# Patient Record
Sex: Male | Born: 1970 | Race: White | Hispanic: No | Marital: Single | Smoking: Current every day smoker
Health system: Southern US, Community
[De-identification: ages and names within clinical notes are randomized; demographics above are authoritative.]

## PROBLEM LIST (undated history)

## (undated) DIAGNOSIS — B1920 Unspecified viral hepatitis C without hepatic coma: Secondary | ICD-10-CM

## (undated) DIAGNOSIS — F1020 Alcohol dependence, uncomplicated: Secondary | ICD-10-CM

## (undated) DIAGNOSIS — B977 Papillomavirus as the cause of diseases classified elsewhere: Secondary | ICD-10-CM

---

## 1998-03-01 ENCOUNTER — Inpatient Hospital Stay (HOSPITAL_COMMUNITY): Admission: EM | Admit: 1998-03-01 | Discharge: 1998-03-04 | Payer: Self-pay | Admitting: Emergency Medicine

## 1998-07-03 ENCOUNTER — Inpatient Hospital Stay (HOSPITAL_COMMUNITY): Admission: EM | Admit: 1998-07-03 | Discharge: 1998-07-07 | Payer: Self-pay | Admitting: Psychiatry

## 2000-02-02 ENCOUNTER — Inpatient Hospital Stay (HOSPITAL_COMMUNITY): Admission: EM | Admit: 2000-02-02 | Discharge: 2000-02-06 | Payer: Self-pay | Admitting: Psychiatry

## 2001-03-11 ENCOUNTER — Emergency Department (HOSPITAL_COMMUNITY): Admission: EM | Admit: 2001-03-11 | Discharge: 2001-03-12 | Payer: Self-pay

## 2001-04-03 ENCOUNTER — Inpatient Hospital Stay (HOSPITAL_COMMUNITY): Admission: EM | Admit: 2001-04-03 | Discharge: 2001-04-07 | Payer: Self-pay | Admitting: Psychiatry

## 2001-04-03 ENCOUNTER — Encounter: Payer: Self-pay | Admitting: Emergency Medicine

## 2001-06-07 ENCOUNTER — Ambulatory Visit (HOSPITAL_COMMUNITY): Admission: RE | Admit: 2001-06-07 | Discharge: 2001-06-07 | Payer: Self-pay | Admitting: Neurology

## 2008-01-12 ENCOUNTER — Encounter: Admission: RE | Admit: 2008-01-12 | Discharge: 2008-01-12 | Payer: Self-pay | Admitting: Family Medicine

## 2009-09-26 IMAGING — CT CT ABDOMEN W/O CM
3 of 7 series · 12 of 42 positions shown, 18 images · non-contrast
Comparison: NONE

CLINICAL DATA: Severe abdominal pain.  Right upper quadrant 
firmness.  

CT ABDOMEN WITHOUT AND WITH INTRAVENOUS AND FOLLOWING ORAL 
CONTRAST
TECHNIQUE: Multiple axial images were obtained from the 
diaphragm to the iliac crest.

[Series 2: wo · axial · 0.80mm/px · z∈[+759,+939]mm · 5 of 55 slices shown]
[im 10/55  soft-tissue]
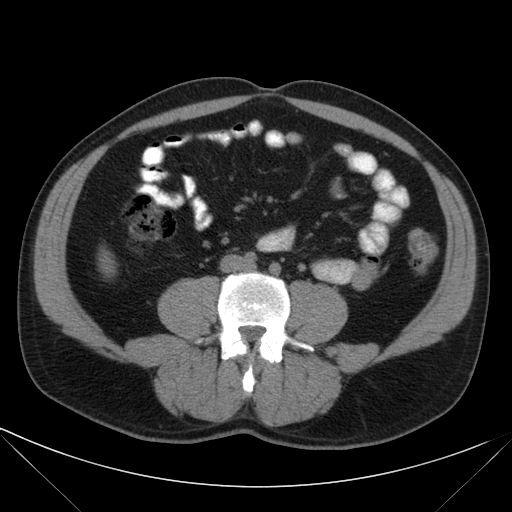
[im 19/55  soft-tissue]
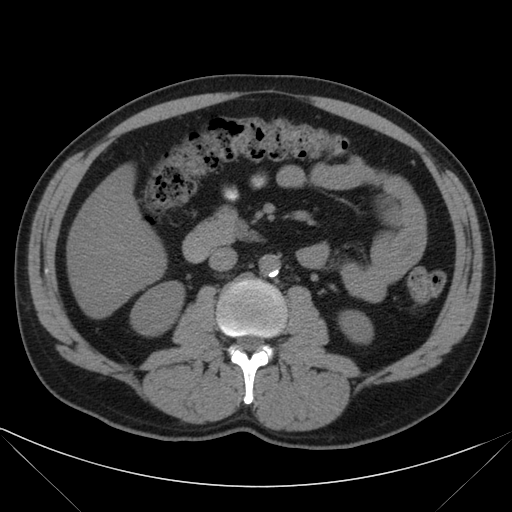
[im 28/55  soft-tissue]
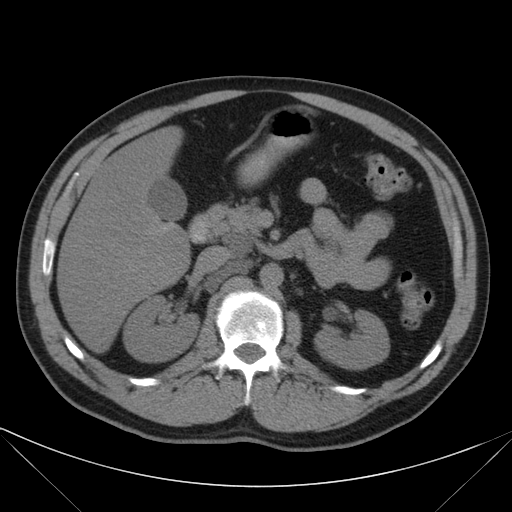
[im 37/55  soft-tissue]
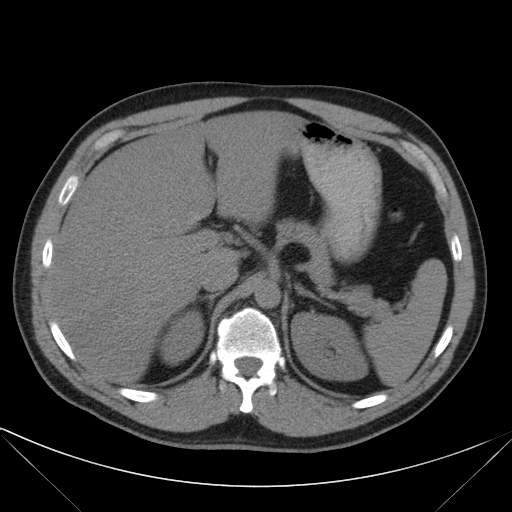
[im 46/55  soft-tissue]
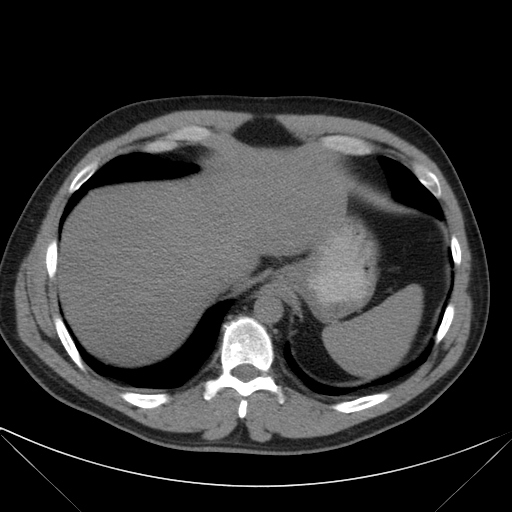

[Series 4: venous · axial · portal-venous · 0.79mm/px · z∈[+764,+928]mm · 4 of 56 slices shown, 9 images]
[im 12/56  soft-tissue]
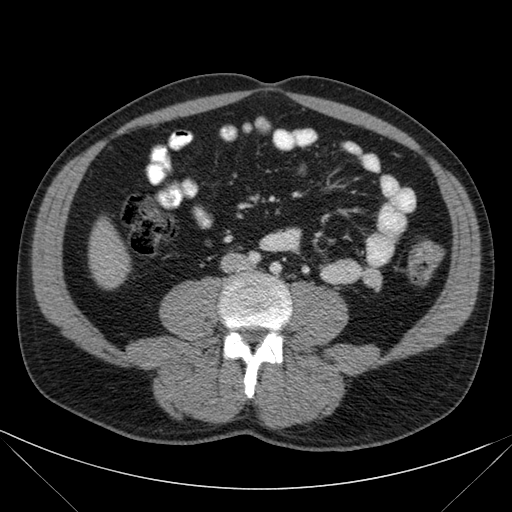
[im 12/56  lung]
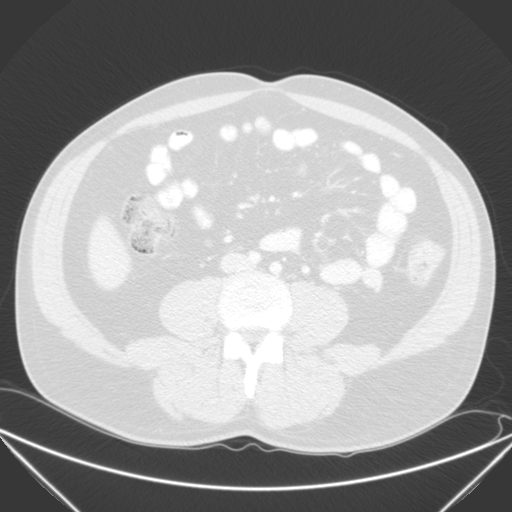
[im 12/56  bone]
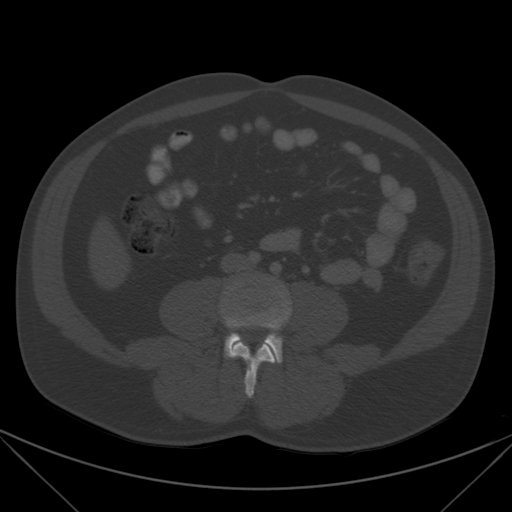
[im 23/56  soft-tissue]
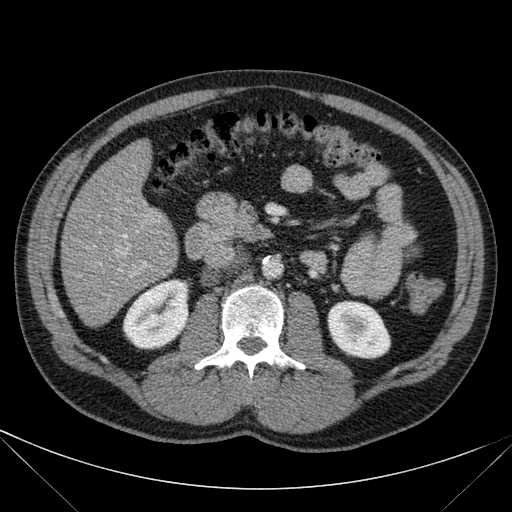
[im 23/56  lung]
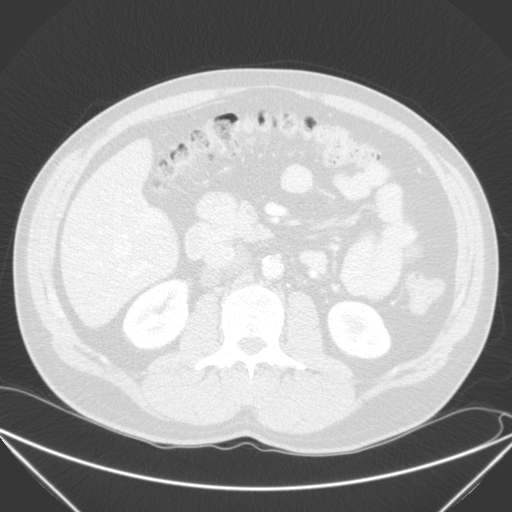
[im 34/56  soft-tissue]
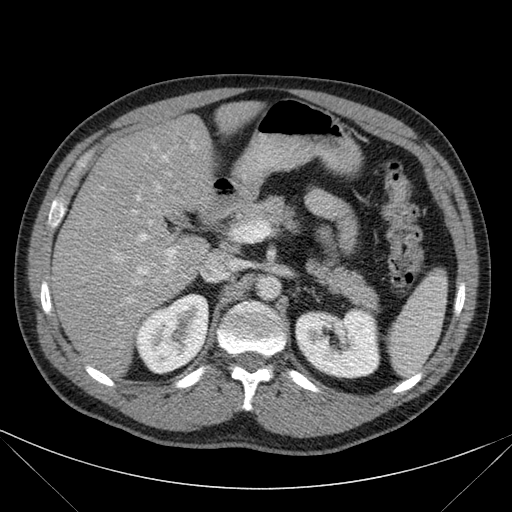
[im 34/56  lung]
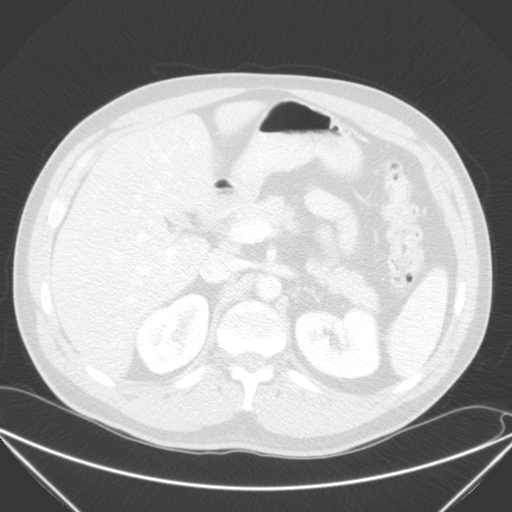
[im 45/56  soft-tissue]
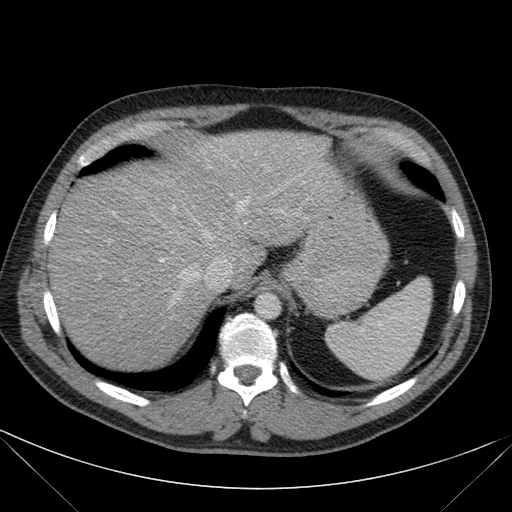
[im 45/56  lung]
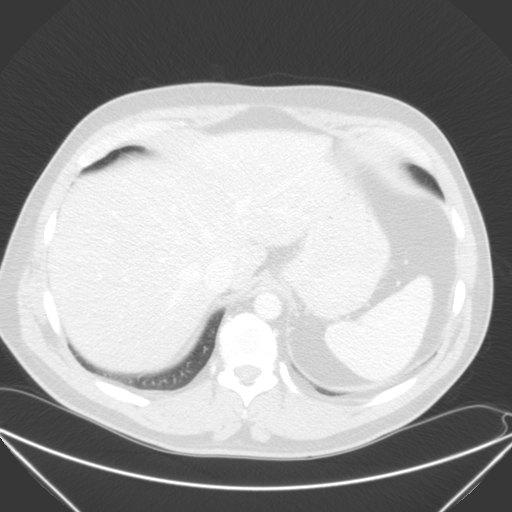

[coronals · coronal · 0.79mm/px · 3 of 88 slices shown, 4 images]
[im 30/88  soft-tissue]
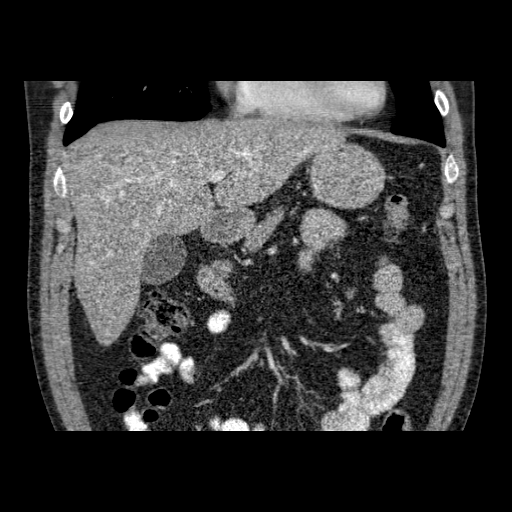
[im 39/88  soft-tissue]
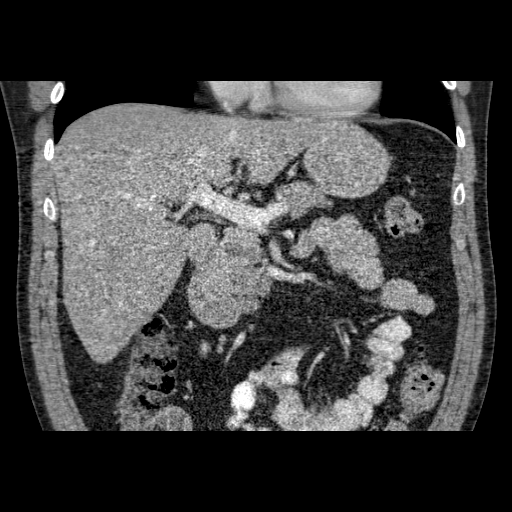
[im 39/88  bone]
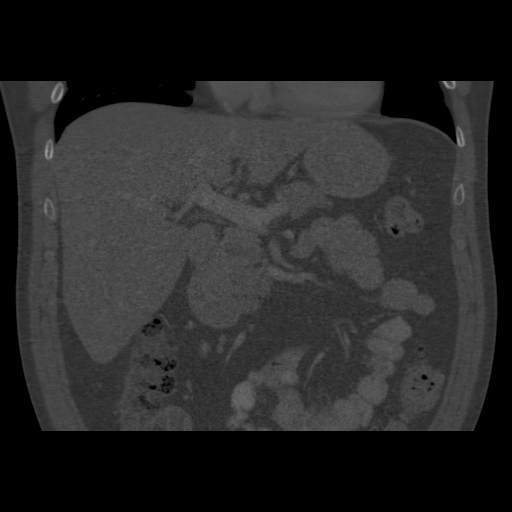
[im 49/88  soft-tissue]
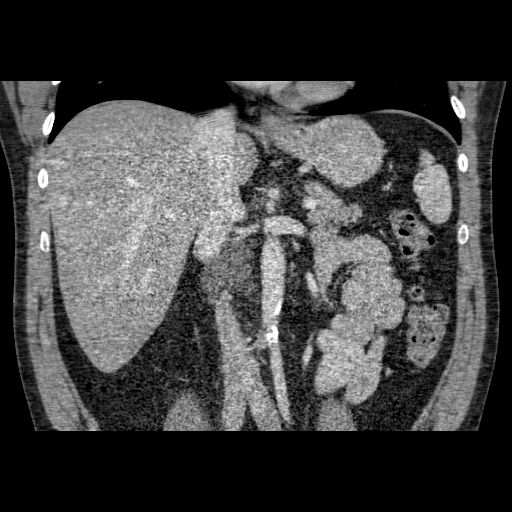

[12 of 42 positions shown; findings below may reference images not displayed]

FINDINGS: No gallstones or renal calculi are identified. There is 
low density in the liver consistent with fatty infiltration. There 
is a low-density collection in the retrocaval and paracaval region 
in the upper abdomen, most likely a lymphocele.  No evidence of 
abdominal aortic aneurysm.  This area does not enhance on the 
post-contrast images. No adrenal or renal mass or evidence of 
renal obstruction.  No bowel or mesenteric mass, adenopathy, or 
inflammatory process. No lung base mass, infiltrate, edema, or 
effusion.  No lytic or blastic lesions are identified.
IMPRESSION: Water-density area adjacent to the vena cava and 
extending into the retrocaval region.  This measures approximately 
5.0 cm transversely, 1.8 cm in depth, and 4.5 cm 
superior-inferior. This most likely represents a lymphocele.  No 
mass, adenopathy, or inflammatory process is noted otherwise. Mild 
fatty liver.  Consider follow up in six months to document 
stability of what is most likely a lymphocele. [place], [place] Date:  01/11/2008 DAS  JLM

## 2010-09-11 NOTE — H&P (Signed)
[facility]  Patient:    Matthew Gentry, Matthew Gentry Visit Number: [id number] MRN: [id number]          Service Type: PSY Location: 500 [id number] Attending Physician:  Rachael Fee Dictated by:   Candi Leash. Orsini, N.P. Admit Date:  04/03/2001                     Psychiatric Admission Assessment  IDENTIFYING INFORMATION:  A 40 year old single white male, voluntarily admitted on April 03, 2001, for depression, alcohol dependence and benzodiazepine abuse.  HISTORY OF PRESENT ILLNESS:  The patient presents with a history of depression, alcohol dependence and benzodiazepine abuse.  The patient reports he has been drinking steadily, up to 6 to 10 beers or a pint of liquor per day for the past 4 years.  The patient relapsed shortly after his last hospitalization of one year ago.  He states his longest period of sobriety has been approximately maybe 1 month.  The patient stopped drinking on his own about 5 days ago.  The patient had a seizure at home with his child present, was transferred to the emergency department.  The patient reports he also stopped using benzodiazepines about 2 weeks ago.  The patient reports some hallucinations during his withdrawal at home, reports none currently.  He reports some depressive symptoms but no suicidal ideation.  He reports some homicidal thoughts towards a boyfriend of a friend that he knows, but with no plan or intent.  His sleep has been fair, his appetite has been satisfactory. The patient reports he is motivated to stop as he "wants this Christmas to be different than last Christmas."  The patient denies any withdrawal symptoms, complains of a current headache.  PAST PSYCHIATRIC HISTORY:  Fourth hospitalization to [facility], last visit was 1 year ago, in October 2001, for depression and alcohol abuse.  SOCIAL HISTORY:  He is a 40 year old single white male with one child, 70 years of age.  He lives alone.   He has joint custody of the child with his mother.  He works as a Nutritional therapist.  He states he will be unemployed.  He has completed the 8th grade.  No legal history.  FAMILY HISTORY:  None.  ALCOHOL DRUG HISTORY:  The patient smokes.  He has been drinking 6-10 beers a day or if not that 1 pint of liquor.  The patient reports that his last drink was approximately 1 week ago when he tried to stop on his own.  He has about a 1 month history of sobriety, with a history of seizures that occurred the day of admission.  The patient reports a long use of Xanax.  He has been on that for years, buys them off the streets.  Last use was 2 weeks ago.  Has been taking 1-2 mg a day.  PAST MEDICAL HISTORY:  Primary care Ron[facility]well is none.  Medical problems are genital herpes.  MEDICATIONS:  The patient has been on Depakote for headaches and Xanax, nonprescribed.  DRUG ALLERGIES:  TRAZODONE.  The patient reports his chest hurts.  PHYSICAL EXAMINATION:  Performed at [facility] Emergency Department.  The patient does present with bruises to his face from his seizure.  His CT scan was negative.  Urine drug screen was positive for THC and positive for benzos. Alcohol level was less than 5.  Blood sugar initially was 193, on December 10 it was 109.  Depakote level was less than 1.  WBC  count was elevated at 15.4. SGOT was elevated at 42. SGPT was elevated at 55.  MENTAL STATUS EXAMINATION:  He is an alert, young middle-aged Caucasian male. He is dressed in scrubs.  No eye contact.  Speech is normal and relevant. Mood is depressed.  Affect is blunt.  Thought processes are coherent.  There is no evidence of psychosis, no auditory or visual hallucinations, no suicidal ideation.  Some positive homicidal ideation with no plan or intent.  Cognitive function is intact.  Judgment is fair, insight is fair, memory is intact.  ADMISSION DIAGNOSES: Axis I:    1. Depressive disorder not otherwise specified.             2. Alcohol dependence.            3. Benzodiazepine dependence.            4. THC abuse. Axis II:   Deferred. Axis III:  Genital herpes. Axis IV:   Problems with occupation and other psychosocial problems. Axis V:    Current 40, estimated this past year 65.  INITIAL PLAN OF CARE:  Voluntary admission to [facility] for alcohol and benzodiazepine dependence and depression.  Contract for safety, check every 15 minutes.  Will initiate the phenobarbital protocol for withdrawal symptoms to prevent seizures.  Will encourage fluids.  The patient is on seizure precautions and is considered to be a high fall risk.  We will repeat his CBC.  Our goal is to detox safely, to increase coping skills, for patient to be medication compliant, to attend CDIOP program or AA meetings after discharge, for patient to remain sober and drug free.  Will monitor depressive symptoms and initiate an antidepressant if deemed necessary.  TENTATIVE LENGTH OF STAY:  3 to 5 days. Dictated by:   Candi Leash. Orsini, N.P. Attending Physician:  Rachael Fee DD:  04/04/01 TD:  04/04/01 Job: [id number]
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
## 2010-09-11 NOTE — Discharge Summary (Signed)
[facility]  Patient:    Matthew Gentry, Matthew Gentry Visit Number: [id number] MRN: [id number]          Service Type: PSY Location: 500 [id number] 02 Attending Physician:  Jeanice Lim Dictated by:   Jeanice Lim, M.D. Admit Date:  04/03/2001 Discharge Date: 04/07/2001                             Discharge Summary  IDENTIFYING DATA:  This is a 40 year old single Caucasian male voluntarily admitted on December 9 for depression, alcohol dependence, and benzodiazepine abuse with escalating use, unable to stop drinking on his own, having tried and resulted in a seizure in his childs presence.  This is the fourth hospitalization at [facility]; last one was one year ago for depression and alcohol use.  MEDICATIONS: 1. Depakote for headaches. 2. Xanax unprescribed.  ALLERGIES:   TRAZODONE.  PHYSICAL EXAMINATION:  VITAL SIGNS:  Stable, afebrile, unremarkable.  GENERAL:  Positive for bruises on his face from his seizure.  NEUROLOGIC:  Nonfocal.  LABORATORY DATA:  CT scan was negative.  Urine drug screen: Positive for marijuana and benzodiazepines.  Blood sugar initially 193, which then returned to 109.  Depakote level was 0.  White blood cell count was mildly elevated at 15.4.  SGOT elevated at 42, SGPT elevated at 55.  MENTAL STATUS EXAMINATION:  He is an alert, young, middle-aged Caucasian male dressed scrubs, maintaining poor eye contact.  Speech: Within normal limits. Mood: Depressed.  Affect: Blunt.  Thought process: Goal directed.  Thought content: Negative for psychotic symptoms, no dangerous ideation, admitted to some homicidal thoughts with no plan or intent.  Cognitive: Intact.  Judgment and insight were also intact.  ADMITTING DIAGNOSES: Axis I:    1. Depressive disorder, not otherwise specified.            2. Alcohol dependence.            3. Benzodiazepine dependence.            4. Marijuana abuse. Axis II:   None. Axis III:  Genital  herpes. Axis IV:   Moderate problems with occupation and psychosocial support. Axis V:    40/65.  HOSPITAL COURSE:  The patient was ordered routine p.r.n. medications and medications were started, Zoloft targeting depressive symptoms.  He was given phenobarbital and monitored for withdrawal and scheduled to follow up with the CD IOP.  The patient denied significant withdrawal symptoms and had no psychotic symptoms, dangerous ideations, or seizure activity.  CONDITION AT DISCHARGE:  Improved.  Mood was euthymic.  Affect: Bright. Thought process: Goal directed.  Thought content: Negative for psychotic symptoms or dangerous ideation and he reported motivation to be compliant with with a follow-up plan.  DISCHARGE MEDICATIONS: 1. Phenobarbital 15 mg take one dose at 8 p.m. on the day of discharge. 2. Zoloft 25 mg q.a.m.  RECOMMENDATIONS:  The patient was advised to avoid alcohol and benzodiazepines and follow up with CD IOP and then [facility] OPC and AA and NA as well as primary care physician regarding elevated blood pressure.  DISCHARGE DIAGNOSES: Axis I:    1. Depressive disorder, not otherwise specified.            2. Alcohol dependence.            3. Benzodiazepine dependence.            4. Marijuana abuse. Axis II:   None. Axis III:  Genital herpes. Axis IV:   Moderate problems with occupation and psychosocial support. Axis V:    Global assessment of functioning on discharge was 55. Dictated by:   Jeanice Lim, M.D. Attending Physician:  Jeanice Lim DD:  05/23/01 TD:  05/24/01 Job: [id number]

## 2010-09-11 NOTE — Discharge Summary (Signed)
[facility]  Patient:    Matthew Gentry, Matthew Gentry                     MRN: [id number] Adm. Date:  [id number] Disc. Date: 02/06/00 Attending:  Marlyn Corporal Fabmy                           Discharge Summary  ADMISSION DIAGNOSES: Axis I:    Major depressive disorder, recurrent, with psychotic features. Axis II:   Deferred. Axis III:  Human papillomavirus. Axis IV:   Severe, occupational problems and other psychosocial stressors. Axis V:    GAF of 35 on admission, 90 in past year.  DISCHARGE DIAGNOSES: Axis I:    Major depressive disorder, recurrent, with psychotic features. Axis II:   Deferred. Axis III:  Human papillomavirus. Axis IV:   Severe with occupational and other psychosocial problems. Axis V:    GAF of 35 on admission and 80 on discharge.  BRIEF HISTORY:  The patient is a 40 year old single Caucasian male who volunteered this admission to the [facility].  Patient was referred from [facility].  Patient has been experiencing overwhelming feelings of aloneness coupled with suicidal ideas without specific plan.  He has been anorexic and insomniac.  He has been using excessive amounts of alcohol to induce sleep.  He has a history in the past of drug use, but not recently.  He has a history of recurrent depression and has been tried on Prozac and Trazodone in the past.  He became agitated on Prozac and says he is allergic to Trazodone.  In March 2000 he overdosed on Xanax and was detoxified.  COURSE IN HOSPITAL:  The patient was detoxified on a p.r.n. Librium protocol. This did not show evidence of any major or minor symptoms of withdrawal from alcohol.  On February 03, 2000 he was started on a combination of Depakote ER, Seroquel, and Effexor with benefit.  The patient tolerated his medication well and without any side effects.  He started sleeping through the night and now wakes up with a clear head.  He assures me that we  have interrupted the alcoholic pattern.  He is dependent on more sleep and now that he is sleeping through the night, he does not need to drink anymore.  In terms of the admission lab work, his CBC was essentially unremarkable. Routine chemistry was unremarkable.  Hepatic profile showed elevation of the SGOT at 46 and SGPT at 85.  These results were discussed with the patient and their implications were again discussed with him.  Given that his grandfather died of cirrhosis of the liver, the patient well understands the relationship between fatty infiltration and eventually cirrhosis of the liver.  Depakote level was at 22.4 and will be repeated in 2-3 weeks.  However, given the patients clinical response at this point, I feel that he is _________ improved.  CONDITION ON DISCHARGE:  Patient is alert and is reactive.  His mood is stable.  He categorically denies suicidal or homicidal _________ .  There is no evidence of any psychotic symptoms and his thinking is clear and goal directed.  He is motivated to start the IOP program Monday and today he says he is ready to move on with his life.  MEDICATION AND FOLLOW-UP:  Patient is discharged with prescriptions for Depakote ER 500 mg two h.s., Seroquel 25 mg two h.s., and Effexor XR 75 mg q.d.  He is to start IOP on Monday and is to follow with me at the office in four weeks. DD:  02/06/00 TD:  02/06/00 Job: [id number]

## 2017-10-10 ENCOUNTER — Emergency Department (HOSPITAL_COMMUNITY): Admission: EM | Admit: 2017-10-10 | Discharge: 2017-10-10 | Discharge status: Absent without leave | Payer: Self-pay

## 2017-10-10 NOTE — ED Notes (Signed)
Pt not answering for triage  

## 2017-10-10 NOTE — ED Notes (Addendum)
Pt left from triage room, this RN asked him multiple times to please return to triage room. Pt refused. Went out to waiting room, sat on floor, this RN grabbed SalidaJeff PA to notify him. Pt was no longer in waiting room when this RN and Trey PaulaJeff PA went to waiting room. Pt had left ED waiting room.

## 2017-10-11 ENCOUNTER — Encounter (HOSPITAL_COMMUNITY): Payer: Self-pay | Admitting: Emergency Medicine

## 2017-10-11 ENCOUNTER — Emergency Department (HOSPITAL_COMMUNITY)
Admission: EM | Admit: 2017-10-11 | Discharge: 2017-10-11 | Disposition: A | Discharge status: Home / Self Care | Payer: Self-pay | Attending: Emergency Medicine | Admitting: Emergency Medicine

## 2017-10-11 ENCOUNTER — Ambulatory Visit (HOSPITAL_COMMUNITY)
Admission: RE | Admit: 2017-10-11 | Discharge: 2017-10-11 | Disposition: A | Discharge status: Home / Self Care | Payer: Self-pay | Attending: Psychiatry | Admitting: Psychiatry

## 2017-10-11 DIAGNOSIS — R109 Unspecified abdominal pain: Secondary | ICD-10-CM | POA: Insufficient documentation

## 2017-10-11 DIAGNOSIS — F1020 Alcohol dependence, uncomplicated: Secondary | ICD-10-CM | POA: Insufficient documentation

## 2017-10-11 DIAGNOSIS — Z5321 Procedure and treatment not carried out due to patient leaving prior to being seen by health care provider: Secondary | ICD-10-CM | POA: Insufficient documentation

## 2017-10-11 HISTORY — DX: Unspecified viral hepatitis C without hepatic coma: B19.20

## 2017-10-11 HISTORY — DX: Papillomavirus as the cause of diseases classified elsewhere: B97.7

## 2017-10-11 HISTORY — DX: Alcohol dependence, uncomplicated: F10.20

## 2017-10-11 LAB — COMPREHENSIVE METABOLIC PANEL
ALK PHOS: 85 U/L (ref 38–126)
ALT: 22 U/L (ref 17–63)
AST: 26 U/L (ref 15–41)
Albumin: 3.7 g/dL (ref 3.5–5.0)
Anion gap: 7 (ref 5–15)
BUN: 18 mg/dL (ref 6–20)
CALCIUM: 9.1 mg/dL (ref 8.9–10.3)
CO2: 29 mmol/L (ref 22–32)
CREATININE: 1.15 mg/dL (ref 0.61–1.24)
Chloride: 108 mmol/L (ref 101–111)
GFR calc Af Amer: >60 mL/min (ref >60)
GFR calc non Af Amer: >60 mL/min (ref >60)
GLUCOSE: 108 mg/dL — AB (ref 65–99)
Potassium: 4.1 mmol/L (ref 3.5–5.1)
SODIUM: 144 mmol/L (ref 135–145)
Total Bilirubin: 1.1 mg/dL (ref 0.3–1.2)
Total Protein: 6.9 g/dL (ref 6.5–8.1)

## 2017-10-11 LAB — CBC
HCT: 48.2 % (ref 39.0–52.0)
Hemoglobin: 16.4 g/dL (ref 13.0–17.0)
MCH: 34.9 pg — AB (ref 26.0–34.0)
MCHC: 34 g/dL (ref 30.0–36.0)
MCV: 102.6 fL — ABNORMAL HIGH (ref 78.0–100.0)
PLATELETS: 227 10*3/uL (ref 150–400)
RBC: 4.7 MIL/uL (ref 4.22–5.81)
RDW: 14.2 % (ref 11.5–15.5)
WBC: 8.8 10*3/uL (ref 4.0–10.5)

## 2017-10-11 LAB — LIPASE, BLOOD: Lipase: 39 U/L (ref 11–51)

## 2017-10-11 MED ORDER — LACTATED RINGERS IV SOLN: INTRAVENOUS | Status: DC

## 2017-10-11 NOTE — ED Notes (Signed)
Pt aware we need a urine specimen. 

## 2017-10-11 NOTE — ED Notes (Signed)
Pt called for in lobby and while getting a wheelchair for pt, pt walked to bathroom and before getting in bathroom he was informed we needed a urine sample and to wait to use the bathroom before going. Pt ignored instructions and proceeded to go into bathroom and urinate. Pt informed that the UA takes about an hour to result once obtaining sample. Pt encouraged that next time he needs to urinate to go to restroom across the hall and provide sample. Urine cup at bedside.

## 2017-10-11 NOTE — ED Triage Notes (Signed)
Pt states he is here for right upper abd pain for two days. Pt states it comes "like contractions." Denies n/v/diarrhea.

## 2017-10-11 NOTE — ED Notes (Signed)
Pt ambulates well. Steady gait noted. No assistance needed.

## 2017-10-11 NOTE — ED Notes (Addendum)
Pt belligerent, ripping off all monitoring equipment and refusing IV, fluids, and CT scan.  EDP aware.

## 2017-10-11 NOTE — ED Provider Notes (Signed)
I signed up to see this patient, but he left prior to evaluation, at 10:45 AM.  Patient had been in the ED yesterday also left after a brief time here and [facility], but left before seeing a provider.  Apparently the patient is intoxicated.     Mancel BaleWentz, Arcadia Gorgas, MD 10/11/17 [phone]

## 2017-10-11 NOTE — ED Notes (Addendum)
Pt stating "I'm leaving".  This RN encouraged pt to stay for further evaluation.  Pt still refusing.  Pt dressed himself and walked out with girlfriend.

## 2017-10-11 NOTE — BH Assessment (Signed)
Assessment Note  Matthew Gentry is an 47 y.o. male.  -Clinician was informed that patient was ready to be seen.  Patient refused to come back to assessment interview rooms.  Clinician went to lobby and talked to patient.  Clinician talked to patient about his physical pain because he rated it at 10/10 for abdominal pain.    Pt says he has pain on left side of abdominal area.  He says it hurts a lot.  Patient denies suicidal thoughts, intention or plan.  No HI.    Pt is accompanied by girlfriend.  She said today that patient was having a conversation with someone else in the room when no one was there.  Patient says he was "talking with my dead grandmother."  Patient says that she was telling him he was messing up his life.  Patient says he started drinking at the current rate six months ago.  Patient says he drinks a 5th per day.  He last drank today.  He has had DUIs and has a court date on 11-01-17.  Patient did not want to come back to the [facility] rooms for Nira Conn, FNP to see him.  When Barbara Cower went to the lobby to see patient, he had left.    Diagnosis: F10.20 ETOH use d/o severe  Past Medical History: No past medical history on file.    Family History: No family history on file.  Social History:  has no tobacco, alcohol, and drug history on file.  Additional Social History:  Alcohol / Drug Use Pain Medications: None Prescriptions: None Over the Counter: None History of alcohol / drug use?: Yes Withdrawal Symptoms: Patient aware of relationship between substance abuse and physical/medical complications, Weakness, Diarrhea, Sweats, Fever / Chills, Irritability, Nausea / Vomiting, Tremors Substance #1 Name of Substance 1: ETOH 1 - Age of First Use: 47 years of age 48 - Amount (size/oz): 1/5th per day 1 - Frequency: Daily use 1 - Duration: on-going 1 - Last Use / Amount: 06/17 around 13:00  CIWA:   COWS:    Allergies: Allergies not on file  Home Medications:  (Not  in a hospital admission)  OB/GYN Status:  No LMP for male patient.  General Assessment Data Location of Assessment: [facility] Assessment Services TTS Assessment: In system Is this a Tele or Face-to-Face Assessment?: Face-to-Face Is this an Initial Assessment or a Re-assessment for this encounter?: Initial Assessment Marital status: Divorced Is patient pregnant?: No Pregnancy Status: No Living Arrangements: Spouse/significant other Can pt return to current living arrangement?: Yes Admission Status: Voluntary Is patient capable of signing voluntary admission?: Yes Referral Source: Self/Family/Friend(gf brought patient in to [facility].) Insurance type: self pay     Crisis Care Plan Living Arrangements: Spouse/significant other Name of Psychiatrist: None Name of Therapist: None  Education Status Is patient currently in school?: No Is the patient employed, unemployed or receiving disability?: Unemployed  Risk to self with the past 6 months Suicidal Ideation: No Has patient been a risk to self within the past 6 months prior to admission? : No Suicidal Intent: No Has patient had any suicidal intent within the past 6 months prior to admission? : No Is patient at risk for suicide?: No Suicidal Plan?: No Has patient had any suicidal plan within the past 6 months prior to admission? : No Access to Means: No What has been your use of drugs/alcohol within the last 12 months?: ETOH Previous Attempts/Gestures: No How many times?: 0 Other Self Harm Risks: SA issues  Triggers for Past Attempts: None known Intentional Self Injurious Behavior: None Family Suicide History: No Recent stressful life event(s): Legal Issues, Financial Problems, Turmoil (Comment) Persecutory voices/beliefs?: No Depression: Yes Depression Symptoms: Despondent, Insomnia, Isolating, Guilt, Loss of interest in usual pleasures Substance abuse history and/or treatment for substance abuse?: Yes Suicide prevention information  given to non-admitted patients: Not applicable  Risk to Others within the past 6 months Homicidal Ideation: No Does patient have any lifetime risk of violence toward others beyond the six months prior to admission? : No Thoughts of Harm to Others: No Current Homicidal Intent: No Current Homicidal Plan: No Access to Homicidal Means: No Identified Victim: No one History of harm to others?: No Assessment of Violence: None Noted Violent Behavior Description: None reported Does patient have access to weapons?: No Criminal Charges Pending?: Yes Describe Pending Criminal Charges: DUI Does patient have a court date: Yes Court Date: 11/01/17 Is patient on probation?: Unknown  Psychosis Hallucinations: Auditory, Visual("I was talking to my dead grandmother today) Delusions: None noted  Mental Status Report Appearance/Hygiene: Disheveled Eye Contact: Poor Motor Activity: Freedom of movement, Unsteady Speech: Logical/coherent Level of Consciousness: Alert Mood: Depressed, Apprehensive, Helpless, Irritable Affect: Anxious, Sad Anxiety Level: Severe Thought Processes: Coherent, Relevant Judgement: Impaired Orientation: Appropriate for developmental age Obsessive Compulsive Thoughts/Behaviors: None  Cognitive Functioning Concentration: Decreased Memory: Recent Impaired, Remote Intact Is patient IDD: No Is patient DD?: Yes Insight: Fair Impulse Control: Poor Appetite: Good Have you had any weight changes? : No Change Sleep: Decreased Total Hours of Sleep: (<4H/D) Vegetative Symptoms: None  ADLScreening [facility] Assessment Services) Patient's cognitive ability adequate to safely complete daily activities?: Yes Patient able to express need for assistance with ADLs?: Yes Independently performs ADLs?: Yes (appropriate for developmental age)  Prior Inpatient Therapy Prior Inpatient Therapy: Yes Prior Therapy Dates: 2002 Prior Therapy Facilty/Provider(s): [facility]  [place] Reason for Treatment: Detox  Prior Outpatient Therapy Prior Outpatient Therapy: No Does patient have an ACCT team?: No Does patient have Intensive In-House Services?  : No Does patient have [facility] services? : No Does patient have P4CC services?: No  ADL Screening (condition at time of admission) Patient's cognitive ability adequate to safely complete daily activities?: Yes Is the patient deaf or have difficulty hearing?: No Does the patient have difficulty seeing, even when wearing glasses/contacts?: No Does the patient have difficulty concentrating, remembering, or making decisions?: Yes Patient able to express need for assistance with ADLs?: Yes Does the patient have difficulty dressing or bathing?: No Independently performs ADLs?: Yes (appropriate for developmental age) Does the patient have difficulty walking or climbing stairs?: No Weakness of Legs: Both(Pt unsteady on his feet.) Weakness of Arms/Hands: None       Abuse/Neglect Assessment (Assessment to be complete while patient is alone) Abuse/Neglect Assessment Can Be Completed: Yes Physical Abuse: Denies Verbal Abuse: Denies Sexual Abuse: Denies Exploitation of patient/patient's resources: Denies Self-Neglect: Denies     Merchant navy officer (For Healthcare) Does Patient Have a Medical Advance Directive?: No Would patient like information on creating a medical advance directive?: No - Patient declined    Additional Information 1:1 In Past 12 Months?: No CIRT Risk: No Elopement Risk: No Does patient have medical clearance?: No     Disposition:  Disposition Initial Assessment Completed for this Encounter: Yes Disposition of Patient: AMA Discharge Patient refused recommended treatment: Yes Type of treatment offered and refused: Other (Comment)(To go to [facility] for med clearance) Mode of transportation if patient is discharged?: Car Patient referred to: Other (  Comment)([facility] for medical clearance)  On  Site Evaluation by:   Reviewed with Physician:    Beatriz StallionHarvey, Erlene Devita Ray 10/11/2017 1:47 AM

## 2017-10-11 NOTE — ED Notes (Signed)
Pt now stating LUQ pain versus the original RUQ complaint.

## 2017-10-11 NOTE — H&P (Signed)
Behavioral Health Medical Screening Exam  Matthew Gentry is an 47 y.o. male who [facility] for alcohol abuse. Patient was briefly seen in the lobby by TTS. According to security, patient left with one of the people that came in with him. Per TTS patient denied suicidal thoughts/homicidal thoughts and patient did not indicate these on his intake sheet. Patient left prior to being seen by this provider. [facility] exam.    Matthew PolingJason A Talmage Teaster, NP 10/11/2017, 1:43 AM

## 2018-04-04 ENCOUNTER — Emergency Department (HOSPITAL_COMMUNITY)
Admission: EM | Admit: 2018-04-04 | Discharge: 2018-04-05 | Disposition: A | Discharge status: Home / Self Care | Payer: Self-pay | Attending: Emergency Medicine | Admitting: Emergency Medicine

## 2018-04-04 DIAGNOSIS — F1094 Alcohol use, unspecified with alcohol-induced mood disorder: Secondary | ICD-10-CM

## 2018-04-04 DIAGNOSIS — F1020 Alcohol dependence, uncomplicated: Secondary | ICD-10-CM | POA: Insufficient documentation

## 2018-04-04 DIAGNOSIS — F17210 Nicotine dependence, cigarettes, uncomplicated: Secondary | ICD-10-CM | POA: Insufficient documentation

## 2018-04-04 LAB — ETHANOL: Alcohol, Ethyl (B): 300 mg/dL — ABNORMAL HIGH (ref <10)

## 2018-04-04 LAB — CBC WITH DIFFERENTIAL/PLATELET
Abs Immature Granulocytes: 0.06 10*3/uL (ref 0.00–0.07)
BASOS PCT: 1 %
Basophils Absolute: 0.1 10*3/uL (ref 0.0–0.1)
EOS ABS: 0.2 10*3/uL (ref 0.0–0.5)
EOS PCT: 2 %
HEMATOCRIT: 49.9 % (ref 39.0–52.0)
Hemoglobin: 16.9 g/dL (ref 13.0–17.0)
Immature Granulocytes: 1 %
LYMPHS ABS: 5.1 10*3/uL — AB (ref 0.7–4.0)
Lymphocytes Relative: 44 %
MCH: 34.5 pg — AB (ref 26.0–34.0)
MCHC: 33.9 g/dL (ref 30.0–36.0)
MCV: 101.8 fL — AB (ref 80.0–100.0)
MONO ABS: 1.1 10*3/uL — AB (ref 0.1–1.0)
MONOS PCT: 9 %
NEUTROS PCT: 43 %
NRBC: 0 % (ref 0.0–0.2)
Neutro Abs: 4.9 10*3/uL (ref 1.7–7.7)
PLATELETS: 195 10*3/uL (ref 150–400)
RBC: 4.9 MIL/uL (ref 4.22–5.81)
RDW: 13.2 % (ref 11.5–15.5)
WBC: 11.4 10*3/uL — ABNORMAL HIGH (ref 4.0–10.5)

## 2018-04-04 LAB — COMPREHENSIVE METABOLIC PANEL
ALBUMIN: 4.2 g/dL (ref 3.5–5.0)
ALT: 43 U/L (ref 0–44)
ANION GAP: 12 (ref 5–15)
AST: 29 U/L (ref 15–41)
Alkaline Phosphatase: 64 U/L (ref 38–126)
BUN: 19 mg/dL (ref 6–20)
CHLORIDE: 106 mmol/L (ref 98–111)
CO2: 23 mmol/L (ref 22–32)
Calcium: 9.2 mg/dL (ref 8.9–10.3)
Creatinine, Ser: 1.16 mg/dL (ref 0.61–1.24)
GFR calc Af Amer: >60 mL/min (ref >60)
Glucose, Bld: 154 mg/dL — ABNORMAL HIGH (ref 70–99)
POTASSIUM: 4.1 mmol/L (ref 3.5–5.1)
Sodium: 141 mmol/L (ref 135–145)
Total Bilirubin: 0.4 mg/dL (ref 0.3–1.2)
Total Protein: 7.7 g/dL (ref 6.5–8.1)

## 2018-04-04 LAB — ACETAMINOPHEN LEVEL: Acetaminophen (Tylenol), Serum: <10 ug/mL — ABNORMAL LOW (ref 10–30)

## 2018-04-04 LAB — SALICYLATE LEVEL: Salicylate Lvl: <7 mg/dL (ref 2.8–30.0)

## 2018-04-04 MED ORDER — LORAZEPAM 2 MG/ML IJ SOLN
2.0000 mg | Freq: Once | INTRAMUSCULAR | Status: AC
  Administered 2018-04-04: 2 mg via INTRAMUSCULAR

## 2018-04-04 MED ORDER — ZIPRASIDONE MESYLATE 20 MG IM SOLR
20.0000 mg | Freq: Once | INTRAMUSCULAR | Status: AC
  Administered 2018-04-04: 20 mg via INTRAMUSCULAR

## 2018-04-04 MED ORDER — LORAZEPAM 2 MG/ML IJ SOLN
INTRAMUSCULAR | Status: AC
  Filled 2018-04-04: qty 1

## 2018-04-04 MED ORDER — STERILE WATER FOR INJECTION IJ SOLN
INTRAMUSCULAR | Status: AC
  Administered 2018-04-04: 1.2 mL
  Filled 2018-04-04: qty 10

## 2018-04-04 MED ORDER — ZIPRASIDONE MESYLATE 20 MG IM SOLR
INTRAMUSCULAR | Status: AC
  Administered 2018-04-04: 20 mg via INTRAMUSCULAR
  Filled 2018-04-04: qty 20

## 2018-04-04 NOTE — ED Notes (Signed)
PT REFUSES TO CHANGE INTO PAPER SCRUBS PT REFUSES BLOOD DRAW RN NOTIFIED

## 2018-04-04 NOTE — ED Notes (Signed)
2100 Orders given for medication. Pt transported to room 26 in handcuffs in wheelchair with [employer].  Pt still combative with police.

## 2018-04-04 NOTE — ED Notes (Signed)
Pt became aggitated when tech asked to draw blood. Pt began making threats to leave, to kick the [employer] officers knees out. [employer] put pt in handcuffs. Dr Clayborne DanaMesner to pts room. Pt refused to speak to physician but continued being beligerant saying " I have rights"  Dr Clayborne DanaMesner tried to explain to patient the IVC process, however patient continued yelling.

## 2018-04-04 NOTE — ED Notes (Signed)
Pt stated "Do you know what she did.  She does drugs and she pulled the needle out and shot blood all over my blinds."

## 2018-04-04 NOTE — ED Provider Notes (Signed)
Emergency Department Provider Note   I have reviewed the triage vital signs and the nursing notes.   HISTORY  Chief Complaint Psychiatric Evaluation and IVC   HPI Matthew Gentry is a 47 y.o. male who presents the emergency department today for psychiatric evaluation.  Patient called grades were Police Department stated he went to kill himself and had a shotgun.  He reiterated this ideation to me as well.  States that he was upset and did not want to kill himself.  He stated he did not put the shotgun in his mouth however.  He still states he is "fed up".  He has been drinking today.  Maybe about 1 pint of vodka. No other associated or modifying symptoms.    Past Medical History:  Diagnosis Date  . Alcoholic (HCC)   . Hepatitis C   . HPV (human papilloma virus) infection     There are no active problems to display for this patient.   No past surgical history on file.    Allergies Trazodone and nefazodone  No family history on file.  Social History Social History   Tobacco Use  . Smoking status: Current Every Day Smoker    Packs/day: 1.00    Types: Cigarettes  . Smokeless tobacco: Never Used  Substance Use Topics  . Alcohol use: Yes    Comment: 2-3 beers daily. bottle of wine some days  . Drug use: Not Currently    Review of Systems  All other systems negative except as documented in the HPI. All pertinent positives and negatives as reviewed in the HPI. ____________________________________________   PHYSICAL EXAM:  VITAL SIGNS: ED Triage Vitals  Enc Vitals Group     BP 04/04/18 2052 (!) 148/98     Pulse Rate 04/04/18 2052 91     Resp 04/04/18 2052 16     Temp 04/04/18 2052 98.3 F (36.8 C)     Temp Source 04/04/18 2052 Oral     SpO2 04/04/18 2052 94 %     Weight 04/04/18 2052 201 lb (91.2 kg)     Height 04/04/18 2052 5\' 10"  (1.778 m)    Constitutional: Alert and oriented. Well appearing and in no acute distress. Eyes: Conjunctivae are  normal. PERRL. EOMI. Head: Atraumatic. Nose: No congestion/rhinnorhea. Mouth/Throat: Mucous membranes are moist.  Oropharynx non-erythematous. Neck: No stridor.  No meningeal signs.   Cardiovascular: Normal rate, regular rhythm. Good peripheral circulation. Grossly normal heart sounds.   Respiratory: Normal respiratory effort.  No retractions. Lungs CTAB. Gastrointestinal: Soft and nontender. No distention.  Musculoskeletal: No lower extremity tenderness nor edema. No gross deformities of extremities. Neurologic:  Normal speech and language. No gross focal neurologic deficits are appreciated.  Skin:  Skin is warm, dry and intact. No rash noted.   ____________________________________________   LABS (all labs ordered are listed, but only abnormal results are displayed)  Labs Reviewed  COMPREHENSIVE METABOLIC PANEL - Abnormal; Notable for the following components:      Result Value   Glucose, Bld 154 (*)    All other components within normal limits  ETHANOL - Abnormal; Notable for the following components:   Alcohol, Ethyl (B) 300 (*)    All other components within normal limits  CBC WITH DIFFERENTIAL/PLATELET - Abnormal; Notable for the following components:   WBC 11.4 (*)    MCV 101.8 (*)    MCH 34.5 (*)    Lymphs Abs 5.1 (*)    Monocytes Absolute 1.1 (*)  All other components within normal limits  ACETAMINOPHEN LEVEL - Abnormal; Notable for the following components:   Acetaminophen (Tylenol), Serum <10 (*)    All other components within normal limits  SALICYLATE LEVEL  RAPID URINE DRUG SCREEN, HOSP PERFORMED   ____________________________________________  EKG   EKG Interpretation  Date/Time:    Ventricular Rate:    PR Interval:    QRS Duration:   QT Interval:    QTC Calculation:   R Axis:     Text Interpretation:         ____________________________________________  RADIOLOGY  No results  found.  ____________________________________________   PROCEDURES  Procedure(s) performed:   Procedures  CRITICAL CARE Performed by: Marily Memos Total critical care time: 35 minutes Critical care time was exclusive of separately billable procedures and treating other patients. Critical care was necessary to treat or prevent imminent or life-threatening deterioration. Critical care was time spent personally by me on the following activities: development of treatment plan with patient and/or surrogate as well as nursing, discussions with consultants, evaluation of patient's response to treatment, examination of patient, obtaining history from patient or surrogate, ordering and performing treatments and interventions, ordering and review of laboratory studies, ordering and review of radiographic studies, pulse oximetry and re-evaluation of patient's condition.  ____________________________________________   INITIAL IMPRESSION / ASSESSMENT AND PLAN / ED COURSE  Attempted multiple ways of reasoning with patient to allow him to stay voluntarily.  Patient continued to refuse to allow [place] to draw his blood or to be examined further.  He kept requesting to leave.  At the patient admitted suicidality to me and was under the influence of alcohol at least to some extent I felt the patient was a danger to himself.  Patient was involuntarily committed and will be medicated so that labs can be drawn and then patient can be cleared by psychiatry.    Pertinent labs & imaging results that were available during my care of the patient were reviewed by me and considered in my medical decision making (see chart for details).  ____________________________________________  FINAL CLINICAL IMPRESSION(S) / ED DIAGNOSES  Final diagnoses:  None     MEDICATIONS GIVEN DURING THIS VISIT:  Medications  LORazepam (ATIVAN) 2 MG/ML injection ( Intramuscular Not Given 04/04/18 2213)  ziprasidone (GEODON)  injection 20 mg (20 mg Intramuscular Given 04/04/18 2115)  LORazepam (ATIVAN) injection 2 mg (2 mg Intramuscular Given 04/04/18 2117)  sterile water (preservative free) injection (1.2 mLs  Given 04/04/18 2115)     NEW OUTPATIENT MEDICATIONS STARTED DURING THIS VISIT:  New Prescriptions   No medications on file    Note:  This note was prepared with assistance of Dragon voice recognition software. Occasional wrong-word or sound-a-like substitutions may have occurred due to the inherent limitations of voice recognition software.   Nava Song, Barbara Cower, MD 04/05/18 [phone]

## 2018-04-04 NOTE — ED Triage Notes (Addendum)
Pt chugged 1 pint of vodka tonight, got in a fight with step daughter over her drug use and threw her out of the house. He got sad and called [employer] saying he was going to shoot himself in the mouth with a shotgun. Pt told [employer] he put the shotgun in his mouth to kill himself. Pt sts he wants rehab and that he has been in a 72 hour rehab that didn't help in the past.  Drinks once a week in excess, liqour.

## 2018-04-04 NOTE — ED Notes (Signed)
Bed: WLPT3 Expected date:  Expected time:  Means of arrival:  Comments: 

## 2018-04-04 NOTE — ED Notes (Signed)
Pt alert and uncooperative. Pt refused to answer any questions at this time. Pt resting in bed. Pt safe, will continue to monitor.

## 2018-04-04 NOTE — ED Notes (Addendum)
Pt medicated in gluteal right and left. Pt placed in velcro restraints to the bed per dr order for aggression. Pt continues to yell, I want my lawyer.  This writer attempted to explain the IVC process to patient.  Pt began to yell " I want my phone, I have a right to my phone" Pt sts his long term girlfriend said he needs to get help for his drinking and depression. Pt sts he has had a previous suicide attempt in the past and stayed 4 days in an inpatient rehab that he feels did no good.

## 2018-04-04 NOTE — ED Notes (Signed)
Bed: [id number] Expected date:  Expected time:  Means of arrival:  Comments: Triage 4- IVC on the way

## 2018-04-05 DIAGNOSIS — F1094 Alcohol use, unspecified with alcohol-induced mood disorder: Secondary | ICD-10-CM | POA: Diagnosis present

## 2018-04-05 MED ORDER — NICOTINE 21 MG/24HR TD PT24: 21.0000 mg | MEDICATED_PATCH | Freq: Every day | TRANSDERMAL | Status: DC

## 2018-04-05 MED ORDER — ONDANSETRON HCL 4 MG PO TABS: 4.0000 mg | ORAL_TABLET | Freq: Three times a day (TID) | ORAL | Status: DC | PRN

## 2018-04-05 MED ORDER — ACETAMINOPHEN 325 MG PO TABS: 650.0000 mg | ORAL_TABLET | ORAL | Status: DC | PRN

## 2018-04-05 MED ORDER — ALUM & MAG HYDROXIDE-SIMETH 200-200-20 MG/5ML PO SUSP: 30.0000 mL | Freq: Four times a day (QID) | ORAL | Status: DC | PRN

## 2018-04-05 NOTE — ED Notes (Addendum)
DECLINES TO GIVE URINE SAMPLE. DECLINES TO ANSWER ANY ADDITIONAL ASSESSMENT QUESTION (CIWA). PT "JUST WANT TO LEAVE."

## 2018-04-05 NOTE — ED Notes (Addendum)
Pt fall. Pt was sleeping, got up to use bathroom and fell through foam doors. It appears he may have thought they were hard doors and pushed on them. He fell to the floor, and attempted to go back to sleep on the floor. No injury noted.  Pt was picked up and put in wheelchair and taken to TCU rm 26 for closer observation. Pt was given a urinal and a sitter.  He is now very unsteady on his feet due to ETOH and medication previously given for aggression. Dr Clayborne DanaMesner aware. Pt denies any pain or injury.

## 2018-04-05 NOTE — [facility] Suicide Risk Assessment (Addendum)
Suicide Risk Assessment  Discharge Assessment   [facility] Discharge Suicide Risk Assessment   Principal Problem: Alcohol-induced mood disorder (HCC) Discharge Diagnoses: Principal Problem:   Alcohol-induced mood disorder (HCC)   Total Time spent with patient: 30 minutes  Musculoskeletal: Strength & Muscle Tone: within normal limits Gait & Station: normal Patient leans: N/A  Psychiatric Specialty Exam: Patient denies any suicidal or homicidal ideations or hallucinations. He reports that he drank too much liquor yesterday and it was vodka and orange juice. He stated that he does not remember making any threats to shoot himself and he does not own a shotgun. He reports that he made a comment to the police officer last night that "he can shove that shotgun up his ass." He reports having a pistol, but that his live in girlfriend has secured it. He reports that he needs to get to work for [employer] because he is already late.    Blood pressure 114/81, pulse 74, temperature (!) 97.4 F (36.3 C), temperature source Axillary, resp. rate 17, height 5\' 10"  (1.778 m), weight 91.2 kg, SpO2 92 %.Body mass index is 28.84 kg/m.  General Appearance: Casual  Eye Contact::  Fair  Speech:  Clear and Coherent and Normal Rate409  Volume:  Normal  Mood:  Euthymic and Irritable  Affect:  Congruent  Thought Process:  Coherent and Descriptions of Associations: Intact  Orientation:  Full (Time, Place, and Person)  Thought Content:  WDL  Suicidal Thoughts:  No  Homicidal Thoughts:  No  Memory:  Immediate;   Good Recent;   Good Remote;   Good  Judgement:  Fair  Insight:  Fair  Psychomotor Activity:  Normal  Concentration:  Good  Recall:  Good  Fund of Knowledge:Good  Language: Good  Akathisia:  No  Handed:  Right  AIMS (if indicated):     Assets:  Communication Skills Desire for Improvement Financial Resources/Insurance Housing Physical Health Social Support Transportation  Sleep:      Cognition: WNL  ADL's:  Intact   Mental Status Per Nursing Assessment::   On Admission:    47 y.o. male presenting voluntarily to [facility] ED via [employer]. Per [employer], patient contacted them after chugging vodka stating that he was going to shoot himself in the mouth with a shotgun. Patient had a BAL of 300 upon arrival to the ED. Patient became combative while in the ED and EDP was required to petition IVC in order to administer medications for agitation. IM medications were administered in the evening of 12/10. When this clinician went to assess patient in the morning of 12/11 he stated "I had too much to drink last night. Can I go home now." Patient admitted to making suicidal threats to [employer] but stated "I don't really feel that way. I just wanted attention." Patient stated that he had 2 pints of vodka last night and is an alcoholic. Patient states that when he does not drink alcohol he experiences withdrawal symptoms. He stated that he would be open to help with his alcohol addiction. Patient stated he was admitted to a psychiatric hospital in 2006 for drug use. Patient accessed [facility] in 09/2017 requesting assistance for alcohol detox but left before being seen. Patient stated he traded drugs for alcohol. Patient endorses depressive symptoms of irritability, insomnia, and loss of pleasure. Patient denies SI/HI/AVH. Patient eager to leave ED because he needs to go to work. Patient not willing to answer questions about trauma history. Patient denies having any outpatient resources and  expressed a distrust of doctors, psychiatrists, and therapists. Patient states "They are pimps and I'm not a prostitute."   Demographic Factors:  Male, Caucasian and Access to firearms  Loss Factors: NA  Historical Factors: NA  Risk Reduction Factors:   Sense of responsibility to family, Employed, Living with another person, especially a relative and Positive social support  Continued Clinical Symptoms:  Alcohol/Substance  Abuse/Dependencies  Cognitive Features That Contribute To Risk:  None    Suicide Risk:  Minimal: No identifiable suicidal ideation.  Patients presenting with no risk factors but with morbid ruminations; may be classified as minimal risk based on the severity of the depressive symptoms    Plan Of Care/Follow-up recommendations:  Continue activity as tolerated. Continue diet as recommended by your PCP. Ensure to keep all appointments with outpatient providers. Patient is instructed prior to discharge to: Take all medications as prescribed by his/her mental healthcare provider. Report any adverse effects and or reactions from the medicines to his/her outpatient provider promptly. Patient has been instructed & cautioned: To not engage in alcohol and or illegal drug use while on prescription medicines. In the event of worsening symptoms, patient is instructed to call the crisis hotline, 911 and or go to the nearest ED for appropriate evaluation and treatment of symptoms. To follow-up with his/her primary care provider for your other medical issues, concerns and or health care needs.    Gerlene Burdockravis B Soffia Doshier, FNP 04/05/2018, 10:56 AM

## 2018-04-05 NOTE — Patient Outreach (Signed)
ED Peer Support Specialist Patient Intake (Complete at intake & 30-60 Day Follow-up)  Name: Matthew Gentry  MRN: [id number]  Age: 48 y.o.   Date of Admission: 04/05/2018  Intake: Initial Comments:      Primary Reason Admitted: Alcohol-induced    Lab values: Alcohol/ETOH: Positive Positive UDS? No Amphetamines: No Barbiturates: No Benzodiazepines: No Cocaine: No Opiates: No Cannabinoids: No  Demographic information: Gender: Male Ethnicity: White Marital Status: Single Insurance Status: Other (comment) Ecologist (Work Neurosurgeon, Physicist, medical, etc.: No Lives with: Alone Living situation: House/Apartment  Reported Patient History: Patient reported health conditions: None Patient aware of HIV and hepatitis status: No  In past year, has patient visited ED for any reason? No  Number of ED visits:    Reason(s) for visit:    In past year, has patient been hospitalized for any reason?    Number of hospitalizations:    Reason(s) for hospitalization:    In past year, has patient been arrested? Yes  Number of arrests: 1  Reason(s) for arrest: DWI  In past year, has patient been incarcerated? No  Number of incarcerations:    Reason(s) for incarceration:    In past year, has patient received medication-assisted treatment?    In past year, patient received the following treatments:    In past year, has patient received any harm reduction services? No  Did this include any of the following?    In past year, has patient received care from a mental health provider for diagnosis other than SUD? No  In past year, is this first time patient has overdosed? No  Number of past overdoses:    In past year, is this first time patient has been hospitalized for an overdose? No  Number of hospitalizations for overdose(s):    Is patient currently receiving treatment for a mental health diagnosis? No  Patient reports experiencing difficulty  participating in SUD treatment: No    Most important reason(s) for this difficulty?    Has patient received prior services for treatment? No  In past, patient has received services from following agencies:    Plan of Care:  Suggested follow up at these agencies/treatment centers: (Plan to attend AA meeting )  Other information: CPSS met with Pt and was able to gain information to better assist with providing services. Pt mentioned that he was tired of dealing with the issues that are caused from his drinking issues. Pt made CPSS aware that he does want resources and contact information for AA meetings. CPSS was able to provide contact information to help start Pt in the right direction. CPSS also gave contact information to stay in touch in the community.    Aaron Edelman [name], CPSS  04/05/2018 11:01 AM

## 2018-04-05 NOTE — BHH Counselor (Addendum)
Clinician noted from [employer], RN pt is still sleeping. Clinician to assess once pt is roused/alert.   Redmond Pullingreylese D Alfreddie Consalvo, MS, [facility], [facility] Triage Specialist [phone]

## 2018-04-05 NOTE — BH Assessment (Signed)
[facility]  Per Juanetta BeetsJacqueline Norman, DO, this pt does not require psychiatric hospitalization at this time.  Pt presents under IVC initiated by EDP Marily MemosJason Mesner, MD, which Dr Sharma CovertNorman has rescinded.  Pt does not require any outpatient referrals for follow up, but would benefit from seeing Peer Support Specialists; they will be asked to speak to pt.  Pt's nurse, Morrie Sheldonshley, has been notified.  Doylene Canninghomas Luisantonio Adinolfi, MA Triage Specialist [phone]

## 2018-04-05 NOTE — BHH Counselor (Signed)
Per Barkley BrunsKristine, RN pt received Geodon and Ativan. Pt is currently sleeping and is unable to engage. Discussed with Dr. Elesa MassedWard. Clinician to will continue to monitor.   Redmond Pullingreylese D Deshay Kirstein, MS, [facility], [facility] Triage Specialist [phone]

## 2018-04-05 NOTE — ED Notes (Signed)
IVC D/C'D PER TOM THOMAS

## 2018-04-05 NOTE — BH Assessment (Signed)
Assessment Note  Matthew Gentry is an 47 y.o. male [facility]. Per [employer], patient contacted them after chugging vodka stating that he was going to shoot himself in the mouth with a shotgun. Patient had a BAL of 300 upon arrival to the ED. Patient became combative while in the ED and EDP was required to petition IVC in order to administer medications for agitation. IM medications were administered in the evening of 12/10. When this clinician went to assess patient in the morning of 12/11 he stated "I had too much to drink last night. Can I go home now." Patient admitted to making suicidal threats to [employer] but stated "I don't really feel that way. I just wanted attention." Patient stated that he had 2 pints of vodka last night and is an alcoholic. Patient states that when he does not drink alcohol he experiences withdrawal symptoms. He stated that he would be open to help with his alcohol addiction. Patient stated he was admitted to a psychiatric hospital in 2006 for drug use. [facility] assistance for alcohol detox but left before being seen. Patient stated he traded drugs for alcohol. Patient endorses depressive symptoms of irritability, insomnia, and loss of pleasure. Patient denies SI/HI/AVH. Patient eager to leave ED because he needs to go to work. Patient not willing to answer questions about trauma history. Patient denies having any outpatient resources and expressed a distrust of doctors, psychiatrists, and therapists. Patient states "They are pimps and I'm not a prostitute."   Patient was alert and oriented x 4. He was dressed in scrubs and appeared disheveled. He was irritable and his affect congruent. Patient was confused and was unaware that he had blood drawn. Patient's long term memory was intact but short term impaired. Patient's insight, judgement, and impulse control are poor. Patient does not appear to be responding to internal stimuli  at this time.   Diagnosis: F10.20 Alcohol use disorder, severe  Past Medical History:  Past Medical History:  Diagnosis Date  . Alcoholic (HCC)   . Hepatitis C   . HPV (human papilloma virus) infection     No past surgical history on file.  Family History: No family history on file.  Social History:  reports that he has been smoking cigarettes. He has been smoking about 1.00 pack per day. He has never used smokeless tobacco. He reports that he drinks alcohol. He reports that he has current or past drug history.  Additional Social History:  Alcohol / Drug Use Pain Medications: see MAR Prescriptions: see MAR Over the Counter: see MAR History of alcohol / drug use?: Yes Longest period of sobriety (when/how long): [facility] Substance #1 Name of Substance 1: Alcohol 1 - Age of First Use: pt unable to recall 1 - Amount (size/oz): 1-2 pints of vodka 1 - Frequency: daily 1 - Duration: "years" 1 - Last Use / Amount: 04/03/18- 2 pints of vodka  CIWA: CIWA-Ar BP: 114/81 Pulse Rate: 74 COWS:    Allergies:  Allergies  Allergen Reactions  . Trazodone And Nefazodone     "feels like an elephant is sitting on my chest."    Home Medications:  (Not in a hospital admission)  OB/GYN Status:  No LMP for male patient.  General Assessment Data Assessment unable to be completed: Yes Reason for not completing assessment: Per Barkley BrunsKristine, RN pt received Geodon and Ativan. Pt is currently sleeping and is unable to engage. Discussed with Dr. Elesa MassedWard.  Clinician to will continue to monitor.  Location of Assessment: [facility] ED TTS Assessment: In system Is this a Tele or Face-to-Face Assessment?: Face-to-Face Is this an Initial Assessment or a Re-assessment for this encounter?: Initial Assessment Patient Accompanied by:: N/A Language Other than English: No Living Arrangements: (home) What gender do you identify as?: Male Marital status: Divorced Pregnancy Status: No Living Arrangements: Alone Can pt  return to current living arrangement?: Yes Admission Status: Involuntary Petitioner: ED Attending Is patient capable of signing voluntary admission?: No Referral Source: Self/Family/Friend Insurance type: None     Crisis Care Plan Living Arrangements: Alone     Risk to self with the past 6 months Suicidal Ideation: No-Not Currently/Within Last 6 Months Has patient been a risk to self within the past 6 months prior to admission? : No Suicidal Intent: No Has patient had any suicidal intent within the past 6 months prior to admission? : No Is patient at risk for suicide?: No Suicidal Plan?: No Has patient had any suicidal plan within the past 6 months prior to admission? : Yes Access to Means: No What has been your use of drugs/alcohol within the last 12 months?: 2 pints of vodka Previous Attempts/Gestures: Yes(according to long term girlfriend) How many times?: 1 Other Self Harm Risks: alcohol use Triggers for Past Attempts: None known Intentional Self Injurious Behavior: None Family Suicide History: No Recent stressful life event(s): (none reported) Persecutory voices/beliefs?: No Depression: Yes Depression Symptoms: Feeling angry/irritable, Insomnia, Loss of interest in usual pleasures Substance abuse history and/or treatment for substance abuse?: Yes Suicide prevention information given to non-admitted patients: Not applicable  Risk to Others within the past 6 months Homicidal Ideation: No Does patient have any lifetime risk of violence toward others beyond the six months prior to admission? : No(patient denies but was combative in ED) Thoughts of Harm to Others: No Current Homicidal Intent: No Current Homicidal Plan: No Access to Homicidal Means: No Identified Victim: none History of harm to others?: No Assessment of Violence: None Noted Violent Behavior Description: none noted Does patient have access to weapons?: (patient denies) Criminal Charges Pending?:  No Does patient have a court date: No Is patient on probation?: No  Psychosis Hallucinations: None noted Delusions: None noted  Mental Status Report Appearance/Hygiene: In scrubs Eye Contact: Good Motor Activity: Freedom of movement Speech: Logical/coherent Level of Consciousness: Alert Mood: Irritable Affect: Irritable Anxiety Level: None Thought Processes: Coherent, Relevant Judgement: Impaired Orientation: Person, Place, Time, Situation Obsessive Compulsive Thoughts/Behaviors: None  Cognitive Functioning Concentration: Normal Memory: Recent Intact, Remote Intact Is patient IDD: No Insight: Poor Impulse Control: Poor Appetite: Good Have you had any weight changes? : No Change Sleep: Decreased Total Hours of Sleep: 4 Vegetative Symptoms: None  ADLScreening [facility] Assessment Services) Patient's cognitive ability adequate to safely complete daily activities?: Yes Patient able to express need for assistance with ADLs?: No Independently performs ADLs?: Yes (appropriate for developmental age)  Prior Inpatient Therapy Prior Inpatient Therapy: Yes Prior Therapy Dates: 2006 Prior Therapy Facilty/Provider(s): [facility] Reason for Treatment: benzo addiction  Prior Outpatient Therapy Prior Outpatient Therapy: Yes Prior Therapy Dates: (not in many years) Prior Therapy Facilty/Provider(s): [facility] Reason for Treatment: substance use Does patient have an ACCT team?: No Does patient have Intensive In-House Services?  : No Does patient have [facility] services? : No Does patient have P4CC services?: No  ADL Screening (condition at time of admission) Patient's cognitive ability adequate to safely complete daily activities?: Yes Is the patient deaf or have difficulty  hearing?: No Does the patient have difficulty seeing, even when wearing glasses/contacts?: No Does the patient have difficulty concentrating, remembering, or making decisions?: Yes Patient able to express need for  assistance with ADLs?: No Does the patient have difficulty dressing or bathing?: Yes Independently performs ADLs?: Yes (appropriate for developmental age) Does the patient have difficulty walking or climbing stairs?: No Weakness of Legs: None Weakness of Arms/Hands: None  Home Assistive Devices/Equipment Home Assistive Devices/Equipment: None  Therapy Consults (therapy consults require a physician order) PT Evaluation Needed: No OT Evalulation Needed: No SLP Evaluation Needed: No Abuse/Neglect Assessment (Assessment to be complete while patient is alone) Abuse/Neglect Assessment Can Be Completed: Unable to assess, patient is non-responsive or altered mental status Values / Beliefs Cultural Requests During Hospitalization: None Spiritual Requests During Hospitalization: None Consults Spiritual Care Consult Needed: No Social Work Consult Needed: No Merchant navy officer (For Healthcare) Does Patient Have a Medical Advance Directive?: No Would patient like information on creating a medical advance directive?: No - Patient declined          Disposition: Pending provider dispostion Disposition Initial Assessment Completed for this Encounter: Yes  On Site Evaluation by:   Reviewed with Physician:    Celedonio Miyamoto 04/05/2018 8:05 AM

## 2018-04-05 NOTE — ED Notes (Signed)
TTS Provider at bedside. 

## 2018-04-05 NOTE — ED Notes (Signed)
NO RX GIVEN 

## 2018-04-05 NOTE — ED Notes (Addendum)
PT REQUESTING TO LEAVE AND ALL HIS BELONGINGS. PT THROWING ITEMS IN HIS ROOM. DEMANDING TO SEE THE DOCTOR. PT PHYSICALLY COMING UP ON STAFF STATING THESE DEMANDS (INTO PERSONAL SPACE). PT JUMP OUT AT SITTER BONNIE. SECURITY JAMES PRESENT TO ASSIST. PT MADE AWARE HE IS IVC AND IS UNABLE TO LEAVE AT THIS TIME. MADE AWARE OF PLAN OF CARE. PT AWARE OF NEED FOR URINE SAMPLE TO ASSIST WITH MEDICAL CLEARANCE. PT THREW EMPTY URINAL AND LAUGHED. SECURITY JAMES TO VERBALIZED STANDARD OF BEHAVIORS WITH PT. PT WITH SECURITY PRESENT REMAINS COOPERATIVE YET AGITATED.

## 2018-04-05 NOTE — ED Notes (Signed)
PEER SUPPORT AT BEDSIDE 

## 2018-04-05 NOTE — ED Notes (Signed)
Pt noted on the floor in the hall. Nurse and staff assisted patient to the bathroom. Pt attempted to go to the bathroom unattended, and fell. Pt was then assisted to the bed, pt vital signs were taken and were stable. Pt alert and cooperative. Pt states he has no pain. No injury noted, skin intact, no bruises noted at this time.Dr Clayborne DanaMesner made aware of fall. Pt transferred to bed 26 in TCU where pt can be closely monitored.

## 2018-04-05 NOTE — BHH Counselor (Signed)
Per Consuella LoseElaine, RN pt is currently sleeping. TTS will continue to monitor.   Redmond Pullingreylese D Ellakate Gonsalves, MS, [facility], [facility] Triage Specialist [phone]
